# Patient Record
Sex: Male | Born: 1980
Health system: Southern US, Community
[De-identification: ages and names within clinical notes are randomized; demographics above are authoritative.]

## PROBLEM LIST (undated history)

## (undated) DIAGNOSIS — T7840XA Allergy, unspecified, initial encounter: Secondary | ICD-10-CM

## (undated) DIAGNOSIS — F840 Autistic disorder: Secondary | ICD-10-CM

## (undated) HISTORY — DX: Allergy, unspecified, initial encounter: T78.40XA

---

## 2011-11-15 DIAGNOSIS — Z111 Encounter for screening for respiratory tuberculosis: Secondary | ICD-10-CM | POA: Diagnosis not present

## 2011-11-15 DIAGNOSIS — Z0000 Encounter for general adult medical examination without abnormal findings: Secondary | ICD-10-CM | POA: Diagnosis not present

## 2012-05-25 DIAGNOSIS — Z23 Encounter for immunization: Secondary | ICD-10-CM | POA: Diagnosis not present

## 2013-06-01 DIAGNOSIS — Z23 Encounter for immunization: Secondary | ICD-10-CM | POA: Diagnosis not present

## 2013-08-28 ENCOUNTER — Ambulatory Visit (INDEPENDENT_AMBULATORY_CARE_PROVIDER_SITE_OTHER): Payer: Medicare Other | Admitting: Family Medicine

## 2013-08-28 ENCOUNTER — Encounter (INDEPENDENT_AMBULATORY_CARE_PROVIDER_SITE_OTHER): Payer: Self-pay

## 2013-08-28 ENCOUNTER — Ambulatory Visit (INDEPENDENT_AMBULATORY_CARE_PROVIDER_SITE_OTHER): Payer: Medicare Other

## 2013-08-28 ENCOUNTER — Encounter: Payer: Self-pay | Admitting: Family Medicine

## 2013-08-28 VITALS — BP 110/75 | HR 74 | Temp 98.6°F | Ht 61.5 in | Wt 136.0 lb

## 2013-08-28 DIAGNOSIS — M25562 Pain in left knee: Secondary | ICD-10-CM

## 2013-08-28 DIAGNOSIS — M25569 Pain in unspecified knee: Secondary | ICD-10-CM | POA: Diagnosis not present

## 2013-08-28 DIAGNOSIS — Z0000 Encounter for general adult medical examination without abnormal findings: Secondary | ICD-10-CM | POA: Diagnosis not present

## 2013-08-28 LAB — POCT CBC
Granulocyte percent: 69.1 %G (ref 37–80)
HCT, POC: 47.4 % (ref 43.5–53.7)
Hemoglobin: 15.3 g/dL (ref 14.1–18.1)
Lymph, poc: 2.3 (ref 0.6–3.4)
MCH, POC: 28.8 pg (ref 27–31.2)
MCHC: 32.3 g/dL (ref 31.8–35.4)
MCV: 89 fL (ref 80–97)
MPV: 8.6 fL (ref 0–99.8)
POC Granulocyte: 5.3 (ref 2–6.9)
POC LYMPH PERCENT: 29.8 %L (ref 10–50)
Platelet Count, POC: 170 10*3/uL (ref 142–424)
RBC: 5.3 M/uL (ref 4.69–6.13)
RDW, POC: 13 %
WBC: 7.7 10*3/uL (ref 4.6–10.2)

## 2013-08-28 MED ORDER — MELOXICAM 15 MG PO TABS: 15.0000 mg | ORAL_TABLET | Freq: Every day | ORAL | Status: DC

## 2013-08-28 NOTE — Patient Instructions (Signed)

## 2013-08-28 NOTE — Progress Notes (Signed)
   Subjective:    Patient ID: Douglas Willis, male    DOB: 09/26/1980, 33 y.o.   MRN: [id number]  HPI This 33 y.o. male presents for evaluation of left knee arthralgias. He had a skate board accident 10 years ago and injured his left knee. He is due for CPE.   Review of Systems C/o left knee arthralgias No chest pain, SOB, HA, dizziness, vision change, N/V, diarrhea, constipation, dysuria, urinary urgency or frequency, or rash.     Objective:   Physical Exam  Vital signs noted  Well developed well nourished male.  HEENT - Head atraumatic Normocephalic                Eyes - PERRLA, Conjuctiva - clear Sclera- Clear EOMI                Ears - EAC's Wnl TM's Wnl Gross Hearing WNL                Nose - Nares patent                 Throat - oropharanx wnl Respiratory - Lungs CTA bilateral Cardiac - RRR S1 and S2 without murmur GI - Abdomen soft Nontender and bowel sounds active x 4 Extremities - No edema. Neuro - Grossly intact. MS - Left knee with normal exam.  Xray of left knee - No fracture Prelimnary reading by Iverson Alamin     Assessment & Plan:  Left knee pain - Plan: DG Knee 1-2 Views Left, meloxicam (MOBIC) 15 MG tablet.  If pain not better then can call and will refer to ortho.  Routine general medical examination at a health care facility - Plan: POCT CBC, CMP14+EGFR, Lipid panel, Thyroid Panel With TSH, Vit D  25 hydroxy (rtn osteoporosis monitoring).  Lysbeth Penner FNP

## 2013-08-29 LAB — CMP14+EGFR
ALT: 14 IU/L (ref 0–44)
AST: 17 IU/L (ref 0–40)
Albumin/Globulin Ratio: 1.9 (ref 1.1–2.5)
Albumin: 4.8 g/dL (ref 3.5–5.5)
Alkaline Phosphatase: 63 IU/L (ref 39–117)
BUN/Creatinine Ratio: 10 (ref 8–19)
BUN: 11 mg/dL (ref 6–20)
CO2: 28 mmol/L (ref 18–29)
Calcium: 10.2 mg/dL (ref 8.7–10.2)
Chloride: 99 mmol/L (ref 97–108)
Creatinine, Ser: 1.08 mg/dL (ref 0.76–1.27)
GFR calc Af Amer: 106 mL/min/{1.73_m2} (ref >59)
GFR calc non Af Amer: 92 mL/min/{1.73_m2} (ref >59)
Globulin, Total: 2.5 g/dL (ref 1.5–4.5)
Glucose: 80 mg/dL (ref 65–99)
Potassium: 4.4 mmol/L (ref 3.5–5.2)
Sodium: 142 mmol/L (ref 134–144)
Total Bilirubin: 1.1 mg/dL (ref 0.0–1.2)
Total Protein: 7.3 g/dL (ref 6.0–8.5)

## 2013-08-29 LAB — LIPID PANEL
Chol/HDL Ratio: 2.8 ratio units (ref 0.0–5.0)
Cholesterol, Total: 250 mg/dL — ABNORMAL HIGH (ref 100–199)
HDL: 90 mg/dL (ref >39)
LDL Calculated: 146 mg/dL — ABNORMAL HIGH (ref 0–99)
Triglycerides: 69 mg/dL (ref 0–149)
VLDL Cholesterol Cal: 14 mg/dL (ref 5–40)

## 2013-08-29 LAB — VITAMIN D 25 HYDROXY (VIT D DEFICIENCY, FRACTURES): Vit D, 25-Hydroxy: 11.8 ng/mL — ABNORMAL LOW (ref 30.0–100.0)

## 2013-08-29 LAB — THYROID PANEL WITH TSH
Free Thyroxine Index: 2.1 (ref 1.2–4.9)
T3 Uptake Ratio: 32 % (ref 24–39)
T4, Total: 6.6 ug/dL (ref 4.5–12.0)
TSH: 1.34 u[IU]/mL (ref 0.450–4.500)

## 2013-08-30 ENCOUNTER — Other Ambulatory Visit: Payer: Self-pay | Admitting: Family Medicine

## 2013-09-04 ENCOUNTER — Telehealth: Payer: Self-pay | Admitting: *Deleted

## 2013-09-20 NOTE — Telephone Encounter (Signed)
Pt notified of labs

## 2013-09-20 NOTE — Telephone Encounter (Signed)
Message copied by Baltazar ApoJOYCE, Fredrico Beedle W on Thu Sep 20, 2013  4:01 PM ------      Message from: Deatra CanterXFORD, WILLIAM J      Created: Thu Aug 30, 2013 10:09 AM       Take fish oil tablets otc as directed and vitamin D 1000 iu po qd for elevated chol and low vit D. ------

## 2014-05-23 ENCOUNTER — Ambulatory Visit: Payer: Medicare Other

## 2014-05-25 DIAGNOSIS — Z23 Encounter for immunization: Secondary | ICD-10-CM | POA: Diagnosis not present

## 2014-05-30 ENCOUNTER — Ambulatory Visit: Payer: Medicare Other

## 2015-01-11 ENCOUNTER — Ambulatory Visit (INDEPENDENT_AMBULATORY_CARE_PROVIDER_SITE_OTHER): Payer: Medicare Other | Admitting: General Practice

## 2015-01-11 VITALS — BP 119/77 | HR 104 | Temp 99.1°F | Ht 61.0 in | Wt 134.4 lb

## 2015-01-11 DIAGNOSIS — B029 Zoster without complications: Secondary | ICD-10-CM

## 2015-01-11 MED ORDER — VALACYCLOVIR HCL 1 G PO TABS: 1000.0000 mg | ORAL_TABLET | Freq: Three times a day (TID) | ORAL | Status: DC

## 2015-01-11 NOTE — Progress Notes (Signed)
   Subjective:    Patient ID: Douglas Willis, male    DOB: 1981-06-03, 34 y.o.   MRN: [id number]  HPI Patient presents today complaining of left arm rash, that red and burning. He is accompanied by his father. Reports onset of red rash x 1 week ago, gradually worsened, the raised fluid filled bumps appeared on Thursday. Patient's father reports believing the rash to be poison ivy. Reports applying A&E ointment to affected and taking allergy relief tablets without improvement.      Review of Systems  Constitutional: Positive for fever. Negative for chills.  HENT: Negative for congestion, rhinorrhea, sneezing and sore throat.   Respiratory: Negative for chest tightness and shortness of breath.   Cardiovascular: Negative for chest pain and palpitations.  Gastrointestinal: Negative for abdominal distention.  Skin: Positive for rash.       Red rash to left inner arm, from arm pit to wrist       Objective:   Physical Exam  Constitutional: He appears well-developed and well-nourished.  HENT:  Head: Normocephalic and atraumatic.  Right Ear: External ear normal.  Left Ear: External ear normal.  Mouth/Throat: Oropharynx is clear and moist.  Eyes: Conjunctivae and EOM are normal. Pupils are equal, round, and reactive to light.  Cardiovascular: Regular rhythm and normal heart sounds.  Tachycardia present.   Pulmonary/Chest: Effort normal and breath sounds normal.  Skin: Rash noted. Rash is vesicular. There is erythema.     Left inner medial arm, has red based rash from just below arm pit to wrist area. Clusters of small vesicles noted along areas of rash. Negative drainage at this time.   Psychiatric: He has a normal mood and affect.          Assessment & Plan:  1. Shingles - valACYclovir (VALTREX) 1000 MG tablet; Take 1 tablet (1,000 mg total) by mouth every 8 (eight) hours.  Dispense: 21 tablet; Refill: 0 -Discussed and provided patient education  -refrain from scratching -avoid  contact with discussed persons -cover affected area loosely -Seek emergency medicine if discussed symptoms develop or current worsen -RTO prn -Patient and guardian verbalized understanding Coralie KeensMae E. Tanvir Hipple, FNP-C

## 2015-01-11 NOTE — Patient Instructions (Signed)
Shingles Shingles is caused by the same virus that causes chickenpox. The first feelings may be pain or tingling. A rash will follow in a couple days. The rash may occur on any area of the body. Long-lasting pain is more likely in an elderly person. It can last months to years. There are medicines that can help prevent pain if you start taking them early. HOME CARE   Take cool baths or place cool cloths on the rash as told by your doctor.  Take medicine only as told by your doctor.  Rest as told by your doctor.  Keep your rash clean with mild soap and cool water or as told by your doctor.  Do not scratch your rash. You may use calamine lotion to relieve itchy skin as told by your doctor.  Keep your rash covered with a loose bandage (dressing).  Avoid touching:  Babies.  Pregnant women.  Children with inflamed skin (eczema).  People who have gotten organ transplants.  People with chronic illnesses, such as leukemia or AIDS.  Wear loose-fitting clothing.  If the rash is on the face, you may need to see a specialist. Keep all appointments. Shingles must be kept away from the eyes, if possible.  Keep all follow-up visits as told by your doctor. GET HELP RIGHT AWAY IF:   You have any pain on the face or eye.  You lose feeling on one side of your face.  You have ear pain or ringing in your ear.  You cannot taste as well.  Your medicines do not help the pain.  Your redness or puffiness (swelling) spreads.  You feel like you are getting worse.  You have a fever. MAKE SURE YOU:   Understand these instructions.  Will watch your condition.  Will get help right away if you are not doing well or get worse. Document Released: 01/26/2008 Document Revised: 12/24/2013 Document Reviewed: 01/26/2008 Exit[employer] Patient Information 2015 [employer]. This information is not intended to replace advice given to you by your health care provider. Make sure you discuss any questions  you have with your health care provider.  

## 2015-05-16 DIAGNOSIS — Z23 Encounter for immunization: Secondary | ICD-10-CM | POA: Diagnosis not present

## 2015-09-05 ENCOUNTER — Encounter: Payer: Self-pay | Admitting: Family Medicine

## 2015-09-05 ENCOUNTER — Ambulatory Visit (INDEPENDENT_AMBULATORY_CARE_PROVIDER_SITE_OTHER): Payer: Medicare Other | Admitting: Family Medicine

## 2015-09-05 VITALS — BP 123/87 | HR 77 | Temp 100.2°F | Ht 61.0 in | Wt 136.0 lb

## 2015-09-05 DIAGNOSIS — B350 Tinea barbae and tinea capitis: Secondary | ICD-10-CM | POA: Diagnosis not present

## 2015-09-05 DIAGNOSIS — L738 Other specified follicular disorders: Secondary | ICD-10-CM

## 2015-09-05 MED ORDER — SULFAMETHOXAZOLE-TRIMETHOPRIM 800-160 MG PO TABS: 1.0000 | ORAL_TABLET | Freq: Two times a day (BID) | ORAL | Status: AC

## 2015-09-05 NOTE — Progress Notes (Signed)
BP 123/87 mmHg  Pulse 77  Temp(Src) 100.2 F (37.9 C) (Oral)  Ht 5\' 1"  (1.549 m)  Wt 136 lb (61.689 kg)  BMI 25.71 kg/m2   Subjective:    Patient ID: Douglas Willis, male    DOB: 1981-08-01, 35 y.o.   MRN: [id number]  HPI: Douglas Willis is a 35 y.o. male presenting on 09/05/2015 for Swelling in right side of face   HPI Bump on beard area Patient is coming in today because he has had a new lump on the right side of his face in the area where his beard comes down. He is shave that area now so we can see it better. He feels like he has a lump there and swelling and it's painful to the touch as well. He does not know that he had any fevers or chills at home but does have a temperature of 100.2 urine the office today. There has not been any drainage out of that site on the skin. He has not had similar infections before.  Relevant past medical, surgical, family and social history reviewed and updated as indicated. Interim medical history since our last visit reviewed. Allergies and medications reviewed and updated.  Review of Systems  Constitutional: Positive for fever. Negative for chills.  HENT: Negative for ear discharge and ear pain.   Eyes: Negative for discharge and visual disturbance.  Respiratory: Negative for shortness of breath and wheezing.   Cardiovascular: Negative for chest pain and leg swelling.  Gastrointestinal: Negative for abdominal pain, diarrhea and constipation.  Genitourinary: Negative for difficulty urinating.  Musculoskeletal: Negative for back pain and gait problem.  Skin: Positive for color change and rash.  Neurological: Negative for syncope, light-headedness and headaches.  All other systems reviewed and are negative.   Per HPI unless specifically indicated above     Medication List       This list is accurate as of: 09/05/15  6:15 PM.  Always use your most recent med list.               sulfamethoxazole-trimethoprim 800-160 MG tablet    Commonly known as:  BACTRIM DS  Take 1 tablet by mouth 2 (two) times daily.     Vitamin D 2000 units Caps  Take by mouth.           Objective:    BP 123/87 mmHg  Pulse 77  Temp(Src) 100.2 F (37.9 C) (Oral)  Ht 5\' 1"  (1.549 m)  Wt 136 lb (61.689 kg)  BMI 25.71 kg/m2  Wt Readings from Last 3 Encounters:  09/05/15 136 lb (61.689 kg)  01/11/15 134 lb 6.4 oz (60.963 kg)  08/28/13 136 lb (61.689 kg)    Physical Exam  Constitutional: He is oriented to person, place, and time. He appears well-developed and well-nourished. No distress.  Eyes: Conjunctivae and EOM are normal. Right eye exhibits no discharge. No scleral icterus.  Neck: Neck supple. No thyromegaly present.  Cardiovascular: Normal rate, regular rhythm, normal heart sounds and intact distal pulses.   No murmur heard. Pulmonary/Chest: Effort normal and breath sounds normal. No respiratory distress. He has no wheezes.  Musculoskeletal: Normal range of motion. He exhibits no edema.  Lymphadenopathy:    He has no cervical adenopathy.  Neurological: He is alert and oriented to person, place, and time. Coordination normal.  Skin: Skin is warm and dry. Lesion (Small erythematous papule on the right mandibular aspect in his beard. Small 0.25 cm area of induration but  no fluctuation. No drainage noted.) noted. No rash noted. He is not diaphoretic. There is erythema.  Psychiatric: He has a normal mood and affect. His behavior is normal.  Nursing note and vitals reviewed.      Assessment & Plan:   Problem List Items Addressed This Visit    None    Visit Diagnoses    Folliculitis barbae    -  Primary    Relevant Medications    sulfamethoxazole-trimethoprim (BACTRIM DS) 800-160 MG tablet        Follow up plan: Return if symptoms worsen or fail to improve.  Counseling provided for all of the vaccine components No orders of the defined types were placed in this encounter.    Arville CareJoshua Brindy Higginbotham, MD [facility] 09/05/2015, 6:15 PM

## 2016-05-12 DIAGNOSIS — Z23 Encounter for immunization: Secondary | ICD-10-CM | POA: Diagnosis not present

## 2017-05-20 DIAGNOSIS — Z23 Encounter for immunization: Secondary | ICD-10-CM | POA: Diagnosis not present

## 2019-07-27 DIAGNOSIS — Z20828 Contact with and (suspected) exposure to other viral communicable diseases: Secondary | ICD-10-CM | POA: Diagnosis not present

## 2020-03-25 ENCOUNTER — Other Ambulatory Visit: Payer: Self-pay

## 2020-03-25 ENCOUNTER — Emergency Department (HOSPITAL_COMMUNITY)
Admission: EM | Admit: 2020-03-25 | Discharge: 2020-03-25 | Disposition: A | Discharge status: Home / Self Care | Payer: Medicare HMO | Attending: Emergency Medicine | Admitting: Emergency Medicine

## 2020-03-25 ENCOUNTER — Encounter (HOSPITAL_COMMUNITY): Payer: Self-pay

## 2020-03-25 DIAGNOSIS — R109 Unspecified abdominal pain: Secondary | ICD-10-CM | POA: Diagnosis present

## 2020-03-25 DIAGNOSIS — K921 Melena: Secondary | ICD-10-CM | POA: Insufficient documentation

## 2020-03-25 HISTORY — DX: Autistic disorder: F84.0

## 2020-03-25 LAB — COMPREHENSIVE METABOLIC PANEL
ALT: 19 U/L (ref 0–44)
AST: 21 U/L (ref 15–41)
Albumin: 4.6 g/dL (ref 3.5–5.0)
Alkaline Phosphatase: 65 U/L (ref 38–126)
Anion gap: 9 (ref 5–15)
BUN: 14 mg/dL (ref 6–20)
CO2: 27 mmol/L (ref 22–32)
Calcium: 10 mg/dL (ref 8.9–10.3)
Chloride: 102 mmol/L (ref 98–111)
Creatinine, Ser: 1.11 mg/dL (ref 0.61–1.24)
GFR calc Af Amer: >60 mL/min (ref >60)
GFR calc non Af Amer: >60 mL/min (ref >60)
Glucose, Bld: 91 mg/dL (ref 70–99)
Potassium: 4.3 mmol/L (ref 3.5–5.1)
Sodium: 138 mmol/L (ref 135–145)
Total Bilirubin: 1.1 mg/dL (ref 0.3–1.2)
Total Protein: 7.9 g/dL (ref 6.5–8.1)

## 2020-03-25 LAB — TYPE AND SCREEN
ABO/RH(D): A POS
Antibody Screen: POSITIVE

## 2020-03-25 LAB — CBC
HCT: 47.8 % (ref 39.0–52.0)
Hemoglobin: 16 g/dL (ref 13.0–17.0)
MCH: 29.7 pg (ref 26.0–34.0)
MCHC: 33.5 g/dL (ref 30.0–36.0)
MCV: 88.7 fL (ref 80.0–100.0)
Platelets: 205 10*3/uL (ref 150–400)
RBC: 5.39 MIL/uL (ref 4.22–5.81)
RDW: 12.4 % (ref 11.5–15.5)
WBC: 7.6 10*3/uL (ref 4.0–10.5)
nRBC: 0 % (ref 0.0–0.2)

## 2020-03-25 LAB — POC OCCULT BLOOD, ED: Fecal Occult Bld: POSITIVE — AB

## 2020-03-25 NOTE — Discharge Instructions (Addendum)
Recommend schedule follow-up appointment with gastroenterology as well as your primary care doctor.  If you develop worsening bleeding, abdominal pain, vomiting, please return to ER for reassessment.

## 2020-03-25 NOTE — ED Triage Notes (Signed)
Patient c/o generalized abdominal pain and blood in his stool x 2 yesterday. Patient unable to state if the blood was dark or bright red.

## 2020-03-25 NOTE — ED Provider Notes (Signed)
[facility]-EMERGENCY DEPT Provider Note   CSN: [id number] Arrival date & time: 03/25/20  1219     History Chief Complaint  Patient presents with  . Blood In Stools  . Abdominal Pain    Douglas Willis is a 39 y.o. male.  Presents to ER with concern for blood in stool.  Patient reports yesterday had 2 bowel movements, both times noted bright red blood.  Felt some abdominal discomfort yesterday while having the bowel movement but does not have any ongoing abdominal pain.  Denies prior history of abdominal surgery, no prior GI bleed, not on anticoagulation.  Patient has autism, history obtained from patient and his father at bedside.  HPI     Past Medical History:  Diagnosis Date  . Allergy   . Autism     There are no problems to display for this patient.   History reviewed. No pertinent surgical history.     Family History  Problem Relation Age of Onset  . Kidney disease Father     Social History   Tobacco Use  . Smoking status: Never Smoker  . Smokeless tobacco: Never Used  Vaping Use  . Vaping Use: Never used  Substance Use Topics  . Alcohol use: No  . Drug use: No    Home Medications Prior to Admission medications   Medication Sig Start Date End Date Taking? Authorizing Provider  sulfamethoxazole-trimethoprim (BACTRIM DS) 800-160 MG tablet Take 1 tablet by mouth 2 (two) times daily. Patient not taking: Reported on 03/25/2020 09/05/15   Dettinger, Elige Radon, MD    Allergies    Patient has no known allergies.  Review of Systems   Review of Systems  Constitutional: Negative for chills and fever.  HENT: Negative for ear pain and sore throat.   Eyes: Negative for pain and visual disturbance.  Respiratory: Negative for cough and shortness of breath.   Cardiovascular: Negative for chest pain and palpitations.  Gastrointestinal: Positive for blood in stool. Negative for abdominal pain and vomiting.  Genitourinary: Negative for dysuria  and hematuria.  Musculoskeletal: Negative for arthralgias and back pain.  Skin: Negative for color change and rash.  Neurological: Negative for seizures and syncope.  All other systems reviewed and are negative.   Physical Exam Updated Vital Signs BP (!) 132/95   Pulse 69   Temp 98.2 F (36.8 C)   Resp 16   Ht 5\' 1"  (1.549 m)   Wt 67 kg   SpO2 99%   BMI 27.89 kg/m   Physical Exam Vitals and nursing note reviewed.  Constitutional:      Appearance: He is well-developed.  HENT:     Head: Normocephalic and atraumatic.  Eyes:     Conjunctiva/sclera: Conjunctivae normal.  Cardiovascular:     Rate and Rhythm: Normal rate and regular rhythm.     Heart sounds: No murmur heard.   Pulmonary:     Effort: Pulmonary effort is normal. No respiratory distress.     Breath sounds: Normal breath sounds.  Abdominal:     Palpations: Abdomen is soft.     Tenderness: There is no abdominal tenderness.  Genitourinary:    Rectum: Normal. Guaiac result positive.     Comments: Brown stool, no hemorrhoids or fissures Musculoskeletal:     Cervical back: Neck supple.  Skin:    General: Skin is warm and dry.  Neurological:     Mental Status: He is alert.     ED Results / Procedures / Treatments  Labs (all labs ordered are listed, but only abnormal results are displayed) Labs Reviewed  POC OCCULT BLOOD, ED - Abnormal; Notable for the following components:      Result Value   Fecal Occult Bld POSITIVE (*)    All other components within normal limits  COMPREHENSIVE METABOLIC PANEL  CBC  TYPE AND SCREEN    EKG None  Radiology No results found.  Procedures Procedures (including critical care time)  Medications Ordered in ED Medications - No data to display  ED Course  I have reviewed the triage vital signs and the nursing notes.  Pertinent labs & imaging results that were available during my care of the patient were reviewed by me and considered in my medical decision making  (see chart for details).    MDM Rules/Calculators/A&P                          39 year old male presents ER with bright red blood per rectum.  On exam well-appearing, soft abdomen, stable vital signs.  Stool brown, Hemoccult positive.  No anemia.  Believe he is appropriate for outpatient management.  Recommended follow-up outpatient with GI.  Reviewed return precautions and discharged home with father.    After the discussed management above, the patient was determined to be safe for discharge.  The patient was in agreement with this plan and all questions regarding their care were answered.  ED return precautions were discussed and the patient will return to the ED with any significant worsening of condition.   Final Clinical Impression(s) / ED Diagnoses Final diagnoses:  Blood in stool    Rx / DC Orders ED Discharge Orders    None       Milagros Loll, MD 03/25/20 1735

## 2020-10-13 ENCOUNTER — Emergency Department (HOSPITAL_COMMUNITY): Payer: Medicare HMO

## 2020-10-13 ENCOUNTER — Encounter (HOSPITAL_COMMUNITY): Payer: Self-pay

## 2020-10-13 ENCOUNTER — Emergency Department (HOSPITAL_COMMUNITY)
Admission: EM | Admit: 2020-10-13 | Discharge: 2020-10-13 | Disposition: A | Discharge status: Home / Self Care | Payer: Medicare HMO | Attending: Emergency Medicine | Admitting: Emergency Medicine

## 2020-10-13 DIAGNOSIS — F840 Autistic disorder: Secondary | ICD-10-CM | POA: Diagnosis not present

## 2020-10-13 DIAGNOSIS — Z008 Encounter for other general examination: Secondary | ICD-10-CM

## 2020-10-13 DIAGNOSIS — R4182 Altered mental status, unspecified: Secondary | ICD-10-CM | POA: Diagnosis not present

## 2020-10-13 DIAGNOSIS — Z20822 Contact with and (suspected) exposure to covid-19: Secondary | ICD-10-CM | POA: Diagnosis not present

## 2020-10-13 DIAGNOSIS — R440 Auditory hallucinations: Secondary | ICD-10-CM | POA: Diagnosis not present

## 2020-10-13 DIAGNOSIS — Z046 Encounter for general psychiatric examination, requested by authority: Secondary | ICD-10-CM | POA: Insufficient documentation

## 2020-10-13 DIAGNOSIS — Z79899 Other long term (current) drug therapy: Secondary | ICD-10-CM | POA: Insufficient documentation

## 2020-10-13 DIAGNOSIS — F22 Delusional disorders: Secondary | ICD-10-CM

## 2020-10-13 LAB — COMPREHENSIVE METABOLIC PANEL
ALT: 19 U/L (ref 0–44)
AST: 21 U/L (ref 15–41)
Albumin: 4.6 g/dL (ref 3.5–5.0)
Alkaline Phosphatase: 54 U/L (ref 38–126)
Anion gap: 10 (ref 5–15)
BUN: 16 mg/dL (ref 6–20)
CO2: 27 mmol/L (ref 22–32)
Calcium: 9.9 mg/dL (ref 8.9–10.3)
Chloride: 103 mmol/L (ref 98–111)
Creatinine, Ser: 1.29 mg/dL — ABNORMAL HIGH (ref 0.61–1.24)
GFR, Estimated: >60 mL/min (ref >60)
Glucose, Bld: 111 mg/dL — ABNORMAL HIGH (ref 70–99)
Potassium: 4.4 mmol/L (ref 3.5–5.1)
Sodium: 140 mmol/L (ref 135–145)
Total Bilirubin: 0.9 mg/dL (ref 0.3–1.2)
Total Protein: 8.3 g/dL — ABNORMAL HIGH (ref 6.5–8.1)

## 2020-10-13 LAB — CBC WITH DIFFERENTIAL/PLATELET
Abs Immature Granulocytes: 0.05 10*3/uL (ref 0.00–0.07)
Basophils Absolute: 0.1 10*3/uL (ref 0.0–0.1)
Basophils Relative: 1 %
Eosinophils Absolute: 0.1 10*3/uL (ref 0.0–0.5)
Eosinophils Relative: 1 %
HCT: 46.3 % (ref 39.0–52.0)
Hemoglobin: 15.4 g/dL (ref 13.0–17.0)
Immature Granulocytes: 1 %
Lymphocytes Relative: 16 %
Lymphs Abs: 1.3 10*3/uL (ref 0.7–4.0)
MCH: 29.4 pg (ref 26.0–34.0)
MCHC: 33.3 g/dL (ref 30.0–36.0)
MCV: 88.5 fL (ref 80.0–100.0)
Monocytes Absolute: 0.6 10*3/uL (ref 0.1–1.0)
Monocytes Relative: 7 %
Neutro Abs: 5.8 10*3/uL (ref 1.7–7.7)
Neutrophils Relative %: 74 %
Platelets: 228 10*3/uL (ref 150–400)
RBC: 5.23 MIL/uL (ref 4.22–5.81)
RDW: 12.9 % (ref 11.5–15.5)
WBC: 7.8 10*3/uL (ref 4.0–10.5)
nRBC: 0 % (ref 0.0–0.2)

## 2020-10-13 LAB — RAPID URINE DRUG SCREEN, HOSP PERFORMED
Amphetamines: NOT DETECTED
Barbiturates: NOT DETECTED
Benzodiazepines: NOT DETECTED
Cocaine: NOT DETECTED
Opiates: NOT DETECTED
Tetrahydrocannabinol: NOT DETECTED

## 2020-10-13 LAB — URINALYSIS, ROUTINE W REFLEX MICROSCOPIC
Bilirubin Urine: NEGATIVE
Glucose, UA: NEGATIVE mg/dL
Hgb urine dipstick: NEGATIVE
Ketones, ur: NEGATIVE mg/dL
Leukocytes,Ua: NEGATIVE
Nitrite: NEGATIVE
Protein, ur: 100 mg/dL — AB
Specific Gravity, Urine: 1.031 — ABNORMAL HIGH (ref 1.005–1.030)
pH: 5 (ref 5.0–8.0)

## 2020-10-13 LAB — RESP PANEL BY RT-PCR (FLU A&B, COVID) ARPGX2
Influenza A by PCR: NEGATIVE
Influenza B by PCR: NEGATIVE
SARS Coronavirus 2 by RT PCR: NEGATIVE

## 2020-10-13 LAB — TSH: TSH: 1.228 u[IU]/mL (ref 0.350–4.500)

## 2020-10-13 MED ORDER — LORAZEPAM 2 MG/ML IJ SOLN
1.0000 mg | Freq: Once | INTRAMUSCULAR | Status: AC
  Administered 2020-10-13: 1 mg via INTRAVENOUS
  Filled 2020-10-13: qty 1

## 2020-10-13 MED ORDER — LORAZEPAM 0.5 MG PO TABS: 0.5000 mg | ORAL_TABLET | Freq: Three times a day (TID) | ORAL | 0 refills | Status: AC | PRN

## 2020-10-13 NOTE — ED Triage Notes (Signed)
Pt arrived via walk in with father, per father, pt has been paranoid at home, expressing someone is out to get him. Progressively getting worse. No hx of psychiatric issues in the past. Pt autistic, cooperative in triage.

## 2020-10-13 NOTE — ED Provider Notes (Addendum)
[facility]-EMERGENCY DEPT Provider Note   CSN: [id number] Arrival date & time: 10/13/20  2951     History Chief Complaint  Patient presents with  . Psychiatric Evaluation    Douglas Willis is a 40 y.o. male.  HPI Patient has history of autism.  He is not typically on any medications.  He lives home with his father.  Normally he does well and does not have history of behavioral problems or hallucinations.  Recently over the past days to weeks the patient has become very paranoid.  He reports he gets very afraid that somebody is going to break into the house.  He feels compelled to try to get his father's guns out of the safe to protect them.  He is also hearing voices.  He denies any thoughts or desire to hurt or kill himself.  He also does not want to hurt anyone else but is very fearful someone is always going to try to get him.  He reports he has felt depressed lately and has had difficulty sleeping.  His father reports he has stayed awake now for long stretches.  His father tried to give him 2 Benadryl yesterday to try to help him relax.  He reports it was not helpful.  Patient does not have any history of drug or alcohol abuse.  No history of being treated with antidepressants or other psychiatric medications.  She denies she has been sick.  He denies has been suffering from headaches, visual problems.  His father reports he has been well with no fevers no chills.  He reports he is fully immunized for COVID.  Reports he is eating without difficulty.  There is been no vomiting or diarrhea.  Patient and father deny any recent traumatic events that might of triggered this.    Past Medical History:  Diagnosis Date  . Allergy   . Autism     There are no problems to display for this patient.   History reviewed. No pertinent surgical history.     Family History  Problem Relation Age of Onset  . Kidney disease Father     Social History   Tobacco Use  .  Smoking status: Never Smoker  . Smokeless tobacco: Never Used  Vaping Use  . Vaping Use: Never used  Substance Use Topics  . Alcohol use: No  . Drug use: No    Home Medications Prior to Admission medications   Medication Sig Start Date End Date Taking? Authorizing Provider  Multiple Vitamins-Minerals (MULTIVITAMIN ADULTS) TABS Take 1 tablet by mouth daily.   Yes [provider]  Multiple Vitamins-Minerals (ZINC PO) Take 1 tablet by mouth daily.   Yes [provider]  sulfamethoxazole-trimethoprim (BACTRIM DS) 800-160 MG tablet Take 1 tablet by mouth 2 (two) times daily. Patient not taking: No sig reported 09/05/15   Dettinger, Elige Radon, MD    Allergies    Patient has no known allergies.  Review of Systems   Review of Systems 10 systems reviewed and negative except as per HPI Physical Exam Updated Vital Signs BP 96/63   Pulse 70   Temp 99.3 F (37.4 C) (Oral)   Resp 13   SpO2 95%   Physical Exam Constitutional:      Comments: Patient is alert and nontoxic.  He is well-nourished well-developed.  He is sitting at the edge of stretcher.  No respiratory distress.  He does appear slightly anxious.  He is occasionally, gently pulling on the skin  on the back of his hands and shifting positions.  He has poor eye contact.  HENT:     Head: Normocephalic and atraumatic.     Nose: Nose normal.     Mouth/Throat:     Mouth: Mucous membranes are moist.     Pharynx: Oropharynx is clear.  Eyes:     Extraocular Movements: Extraocular movements intact.     Pupils: Pupils are equal, round, and reactive to light.  Cardiovascular:     Rate and Rhythm: Normal rate and regular rhythm.  Pulmonary:     Effort: Pulmonary effort is normal.     Breath sounds: Normal breath sounds.  Abdominal:     General: There is no distension.     Palpations: Abdomen is soft.     Tenderness: There is no abdominal tenderness. There is no guarding.  Musculoskeletal:        General: No  swelling or tenderness. Normal range of motion.     Cervical back: Neck supple.     Right lower leg: No edema.     Left lower leg: No edema.  Skin:    General: Skin is warm and dry.  Neurological:     General: No focal deficit present.     Mental Status: He is oriented to person, place, and time.     Cranial Nerves: No cranial nerve deficit.     Coordination: Coordination normal.  Psychiatric:     Comments: Patient is anxious in appearance.  He has poor eye contact.     ED Results / Procedures / Treatments   Labs (all labs ordered are listed, but only abnormal results are displayed) Labs Reviewed  COMPREHENSIVE METABOLIC PANEL - Abnormal; Notable for the following components:      Result Value   Glucose, Bld 111 (*)    Creatinine, Ser 1.29 (*)    Total Protein 8.3 (*)    All other components within normal limits  URINALYSIS, ROUTINE W REFLEX MICROSCOPIC - Abnormal; Notable for the following components:   Specific Gravity, Urine 1.031 (*)    Protein, ur 100 (*)    Bacteria, UA FEW (*)    All other components within normal limits  RESP PANEL BY RT-PCR (FLU A&B, COVID) ARPGX2  CBC WITH DIFFERENTIAL/PLATELET  RAPID URINE DRUG SCREEN, HOSP PERFORMED  TSH  ETHANOL  ACETAMINOPHEN LEVEL  SALICYLATE LEVEL    EKG None  Radiology CT Head Wo Contrast  Result Date: 10/13/2020 CLINICAL DATA:  Mental status change.  Paranoia. EXAM: CT HEAD WITHOUT CONTRAST TECHNIQUE: Contiguous axial images were obtained from the base of the skull through the vertex without intravenous contrast. COMPARISON:  None. FINDINGS: Brain: Within limitations of mild motion artifact, no acute infarct, intracranial hemorrhage, mass, midline shift, or extra-axial fluid collection is identified. The ventricles and sulci are normal in size. Prominent dural calcifications are incidentally noted along the falx and tentorium. Vascular: No hyperdense vessel. Skull: No fracture or suspicious osseous lesion.  Sinuses/Orbits: Visualized paranasal sinuses and mastoid air cells are clear. Visualized orbits are unremarkable. Other: None. IMPRESSION: Negative head CT. Electronically Signed   By: Sebastian Ache M.D.   On: 10/13/2020 11:03    Procedures Procedures   Medications Ordered in ED Medications  LORazepam (ATIVAN) injection 1 mg (1 mg Intravenous Given 10/13/20 1009)    ED Course  I have reviewed the triage vital signs and the nursing notes.  Pertinent labs & imaging results that were available during my care of the patient were  reviewed by me and considered in my medical decision making (see chart for details).    MDM Rules/Calculators/A&P                           Consult: Reviewed with Reola Calkins from behavioral health urgent care.  He advises that they do not typically except autism patients.  Also advises that due to this being a first presentation of psychiatric illness, patient will have to have CT head for potential admission.  Recommendation is for medical diagnostic testing and consult to TTS.  Patient has been generally cooperative.  He did receive Ativan 1 mg to facilitate CT scan.  He did not feel comfortable lying flat and made him feel like he was being "tied down".  Diagnostic work-up within normal limits.  Patient is clinically well in appearance.  Awaiting TTS evaluation for final disposition planning.  Diagnostic work-up is within normal limits.  Patient's father reports he does not wish to wait in the emergency department any longer for psychiatric consultation.  He advises that both he and his son are fatigued and would like to pursue outpatient follow-up.  Patient did get good response to Ativan.  He has been able to rest quietly without agitation.  Will provide short course of Ativan on as needed basis until they can establish psychiatric care and outpatient basis.  Return precautions reviewed.  Final Clinical Impression(s) / ED Diagnoses Final diagnoses:  Paranoid  delusion [facility])  Autism    Rx / DC Orders ED Discharge Orders    None       Arby Barrette, MD 10/13/20 1340    Arby Barrette, MD 10/13/20 1402

## 2020-10-13 NOTE — Discharge Instructions (Signed)
1.  You have been given a medication to help with severe anxiety and restlessness.  Use only as needed.  This medication, Ativan is addictive.  Is very important you follow-up soon with a counselor and or your family doctor. 2.  A resource guide has been included in your discharge instructions to help you find out patient psychiatric care. 3.  Return to the emergency department if symptoms are worsening or new concerning symptoms develop.

## 2020-10-25 DIAGNOSIS — Z20822 Contact with and (suspected) exposure to covid-19: Secondary | ICD-10-CM | POA: Diagnosis not present

## 2020-10-25 DIAGNOSIS — F840 Autistic disorder: Secondary | ICD-10-CM | POA: Diagnosis not present

## 2020-10-25 DIAGNOSIS — Z046 Encounter for general psychiatric examination, requested by authority: Secondary | ICD-10-CM | POA: Diagnosis not present

## 2020-10-25 DIAGNOSIS — F29 Unspecified psychosis not due to a substance or known physiological condition: Secondary | ICD-10-CM | POA: Diagnosis not present

## 2020-10-25 DIAGNOSIS — Z79899 Other long term (current) drug therapy: Secondary | ICD-10-CM | POA: Diagnosis not present

## 2020-10-25 DIAGNOSIS — F209 Schizophrenia, unspecified: Secondary | ICD-10-CM | POA: Diagnosis not present

## 2020-10-25 DIAGNOSIS — R443 Hallucinations, unspecified: Secondary | ICD-10-CM | POA: Diagnosis not present

## 2020-10-26 DIAGNOSIS — R443 Hallucinations, unspecified: Secondary | ICD-10-CM | POA: Diagnosis not present

## 2020-10-26 DIAGNOSIS — Z20822 Contact with and (suspected) exposure to covid-19: Secondary | ICD-10-CM | POA: Diagnosis not present

## 2020-10-27 DIAGNOSIS — F840 Autistic disorder: Secondary | ICD-10-CM | POA: Diagnosis not present

## 2020-10-27 DIAGNOSIS — F29 Unspecified psychosis not due to a substance or known physiological condition: Secondary | ICD-10-CM | POA: Diagnosis not present

## 2020-10-28 DIAGNOSIS — F840 Autistic disorder: Secondary | ICD-10-CM | POA: Diagnosis not present

## 2020-10-28 DIAGNOSIS — F29 Unspecified psychosis not due to a substance or known physiological condition: Secondary | ICD-10-CM | POA: Diagnosis not present

## 2020-10-28 DIAGNOSIS — R809 Proteinuria, unspecified: Secondary | ICD-10-CM | POA: Diagnosis not present

## 2020-10-28 DIAGNOSIS — Z046 Encounter for general psychiatric examination, requested by authority: Secondary | ICD-10-CM | POA: Diagnosis not present

## 2020-10-28 DIAGNOSIS — Z20822 Contact with and (suspected) exposure to covid-19: Secondary | ICD-10-CM | POA: Diagnosis not present

## 2020-10-28 DIAGNOSIS — F819 Developmental disorder of scholastic skills, unspecified: Secondary | ICD-10-CM | POA: Diagnosis not present

## 2020-10-28 DIAGNOSIS — E7800 Pure hypercholesterolemia, unspecified: Secondary | ICD-10-CM | POA: Diagnosis not present

## 2020-10-28 DIAGNOSIS — E538 Deficiency of other specified B group vitamins: Secondary | ICD-10-CM | POA: Diagnosis not present

## 2020-10-28 DIAGNOSIS — Z79899 Other long term (current) drug therapy: Secondary | ICD-10-CM | POA: Diagnosis not present

## 2020-10-28 DIAGNOSIS — R443 Hallucinations, unspecified: Secondary | ICD-10-CM | POA: Diagnosis not present

## 2020-10-28 DIAGNOSIS — F79 Unspecified intellectual disabilities: Secondary | ICD-10-CM | POA: Diagnosis not present

## 2020-10-28 DIAGNOSIS — F209 Schizophrenia, unspecified: Secondary | ICD-10-CM | POA: Diagnosis not present

## 2020-11-07 DIAGNOSIS — F840 Autistic disorder: Secondary | ICD-10-CM | POA: Diagnosis not present

## 2020-11-07 DIAGNOSIS — Z136 Encounter for screening for cardiovascular disorders: Secondary | ICD-10-CM | POA: Diagnosis not present

## 2020-11-07 DIAGNOSIS — Z13220 Encounter for screening for lipoid disorders: Secondary | ICD-10-CM | POA: Diagnosis not present

## 2020-11-07 DIAGNOSIS — F29 Unspecified psychosis not due to a substance or known physiological condition: Secondary | ICD-10-CM | POA: Diagnosis not present

## 2020-11-07 DIAGNOSIS — E538 Deficiency of other specified B group vitamins: Secondary | ICD-10-CM | POA: Diagnosis not present

## 2020-11-11 DIAGNOSIS — F4320 Adjustment disorder, unspecified: Secondary | ICD-10-CM | POA: Diagnosis not present

## 2020-11-19 DIAGNOSIS — F29 Unspecified psychosis not due to a substance or known physiological condition: Secondary | ICD-10-CM | POA: Diagnosis not present

## 2021-02-11 DIAGNOSIS — F29 Unspecified psychosis not due to a substance or known physiological condition: Secondary | ICD-10-CM | POA: Diagnosis not present

## 2021-04-21 DIAGNOSIS — Z20822 Contact with and (suspected) exposure to covid-19: Secondary | ICD-10-CM | POA: Diagnosis not present

## 2021-04-23 DIAGNOSIS — Z20822 Contact with and (suspected) exposure to covid-19: Secondary | ICD-10-CM | POA: Diagnosis not present

## 2021-06-05 DIAGNOSIS — Z0000 Encounter for general adult medical examination without abnormal findings: Secondary | ICD-10-CM | POA: Diagnosis not present

## 2021-06-05 DIAGNOSIS — F840 Autistic disorder: Secondary | ICD-10-CM | POA: Diagnosis not present

## 2021-07-28 DIAGNOSIS — F29 Unspecified psychosis not due to a substance or known physiological condition: Secondary | ICD-10-CM | POA: Diagnosis not present

## 2021-07-28 IMAGING — CT CT HEAD W/O CM
3 series · 14 of 47 positions shown, 16 images · non-contrast
Comparison: None.

CLINICAL DATA: Mental status change.  Paranoia.

EXAM:
CT HEAD WITHOUT CONTRAST
TECHNIQUE: Contiguous axial images were obtained from the base of the skull
through the vertex without intravenous contrast.

[Series 2: head wo · axial · 0.47mm/px · z∈[-164,-39]mm · 8 of 30 slices shown, 10 images]
[im 3/30  brain]
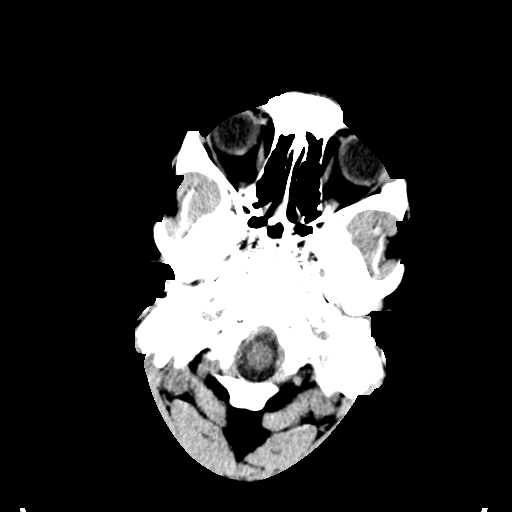
[im 3/30  bone]
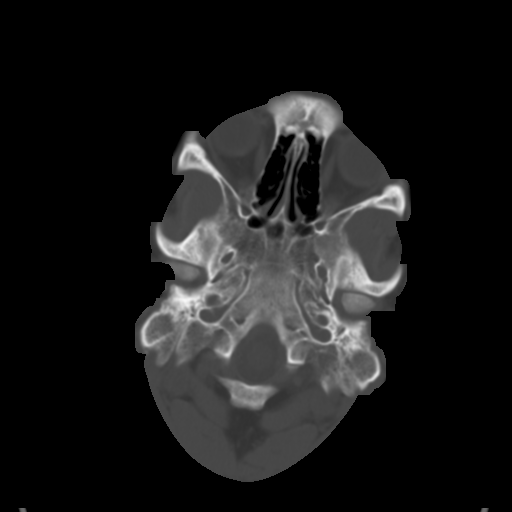
[im 7/30  brain]
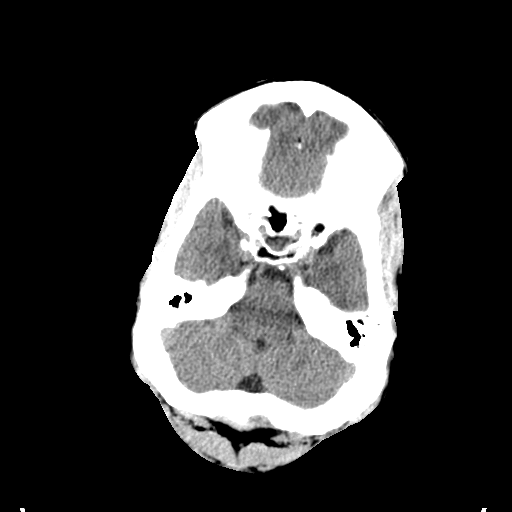
[im 10/30  brain]
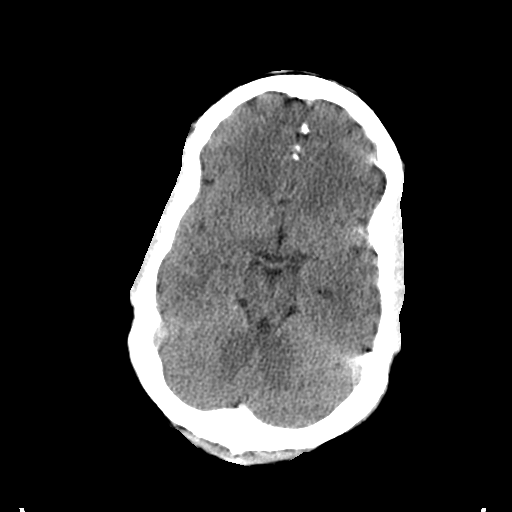
[im 14/30  brain]
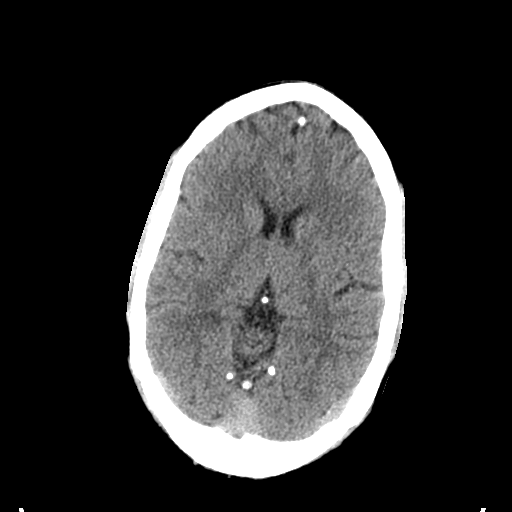
[im 17/30  brain]
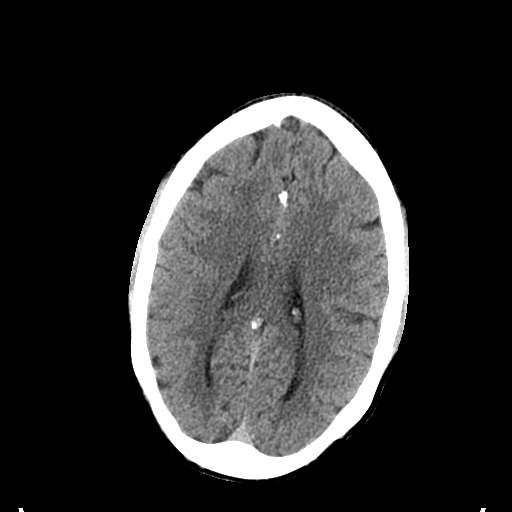
[im 17/30  bone]
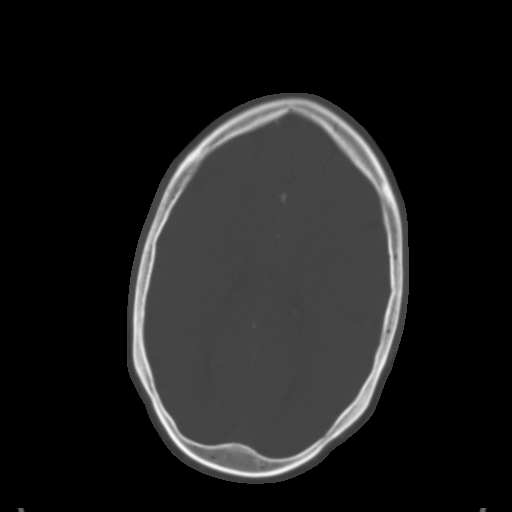
[im 21/30  brain]
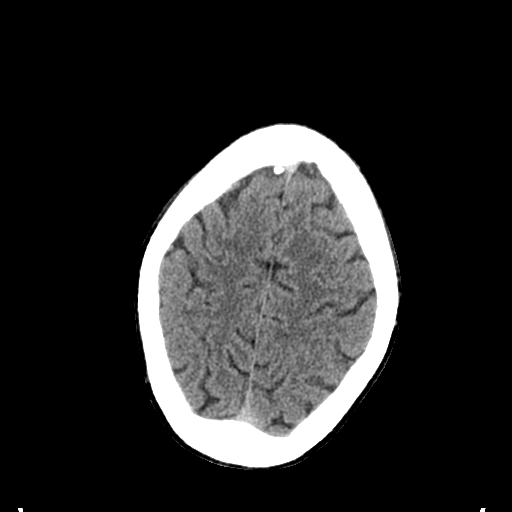
[im 24/30  brain]
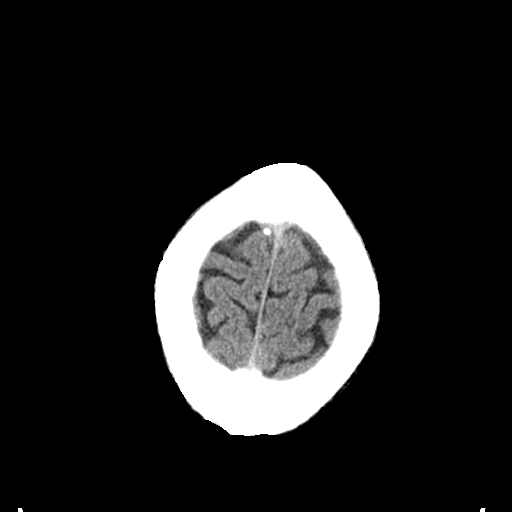
[im 28/30  brain]
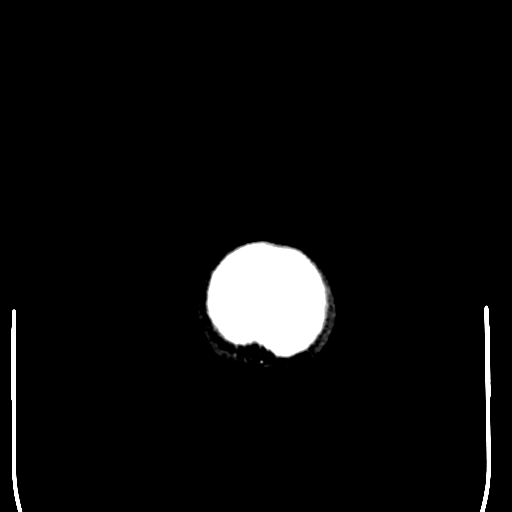

[Series 5: coronal soft tissue · coronal · 0.29mm/px · 3 of 68 slices shown]
[im 23/68  brain]
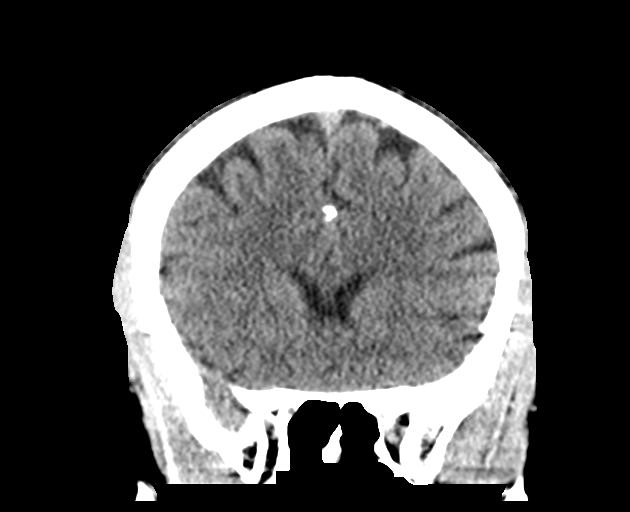
[im 30/68  brain]
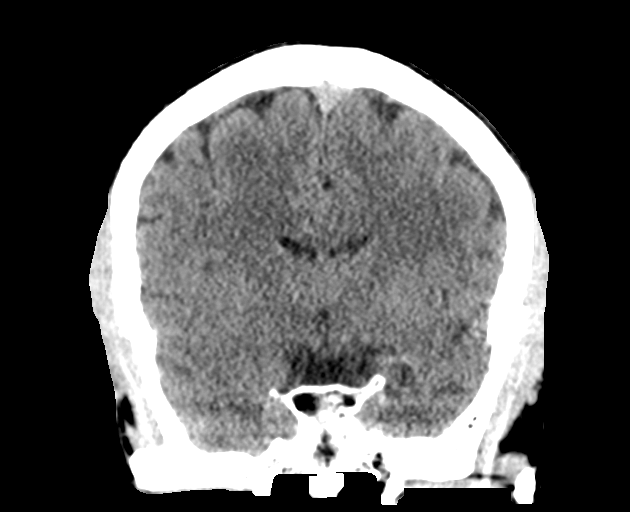
[im 38/68  brain]
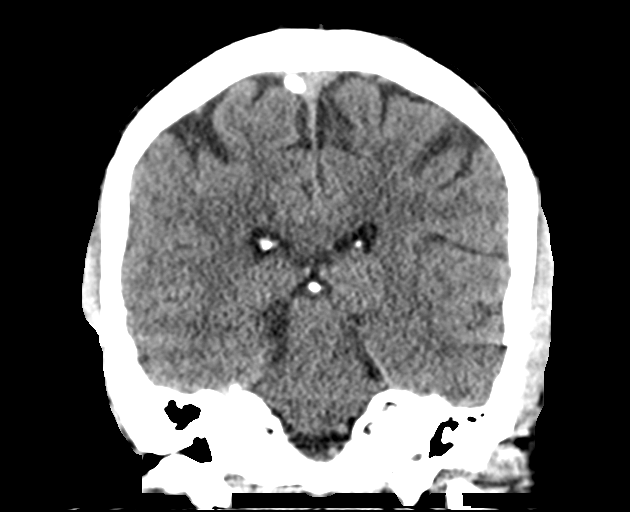

[Series 6: sagittal soft tissue · sagittal · 0.29mm/px · 3 of 53 slices shown]
[im 18/53  brain]
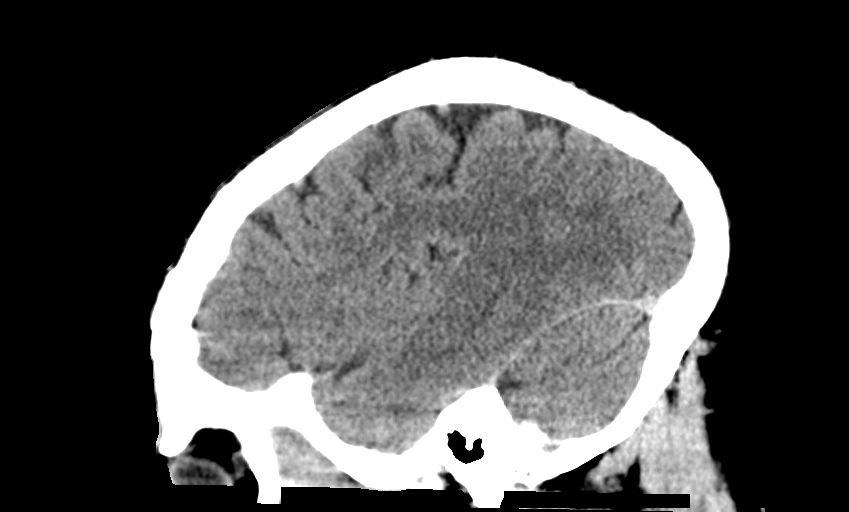
[im 27/53  brain]
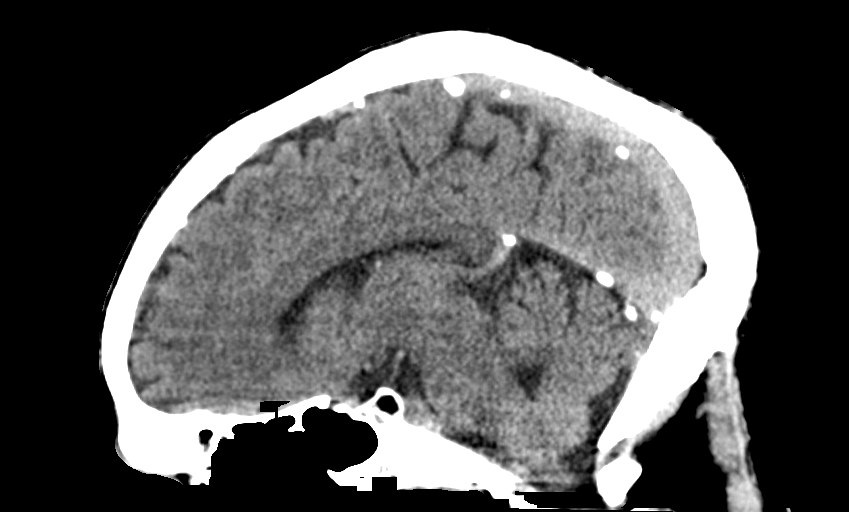
[im 35/53  brain]
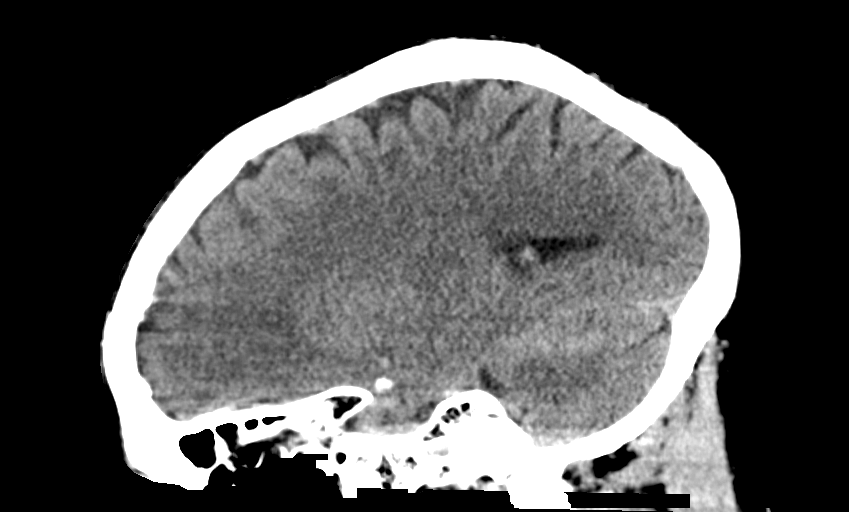

[14 of 47 positions shown; findings below may reference images not displayed]

FINDINGS: Brain: Within limitations of mild motion artifact, no acute infarct,
intracranial hemorrhage, mass, midline shift, or extra-axial fluid
collection is identified. The ventricles and sulci are normal in
size. Prominent dural calcifications are incidentally noted along
the falx and tentorium.

Vascular: No hyperdense vessel.

Skull: No fracture or suspicious osseous lesion.

Sinuses/Orbits: Visualized paranasal sinuses and mastoid air cells
are clear. Visualized orbits are unremarkable.

Other: None.
IMPRESSION: Negative head CT.

## 2022-01-06 DIAGNOSIS — F29 Unspecified psychosis not due to a substance or known physiological condition: Secondary | ICD-10-CM | POA: Diagnosis not present

## 2022-03-19 DIAGNOSIS — Z8271 Family history of polycystic kidney: Secondary | ICD-10-CM | POA: Diagnosis not present

## 2022-04-05 ENCOUNTER — Other Ambulatory Visit: Payer: Self-pay | Admitting: Family Medicine

## 2022-04-05 ENCOUNTER — Ambulatory Visit
Admission: RE | Admit: 2022-04-05 | Discharge: 2022-04-05 | Disposition: A | Discharge status: Home / Self Care | Payer: Medicare HMO | Source: Ambulatory Visit | Attending: Family Medicine | Admitting: Family Medicine

## 2022-04-05 DIAGNOSIS — Z8271 Family history of polycystic kidney: Secondary | ICD-10-CM | POA: Diagnosis not present

## 2022-06-07 DIAGNOSIS — F840 Autistic disorder: Secondary | ICD-10-CM | POA: Diagnosis not present

## 2022-06-07 DIAGNOSIS — Z0000 Encounter for general adult medical examination without abnormal findings: Secondary | ICD-10-CM | POA: Diagnosis not present

## 2022-06-07 DIAGNOSIS — F29 Unspecified psychosis not due to a substance or known physiological condition: Secondary | ICD-10-CM | POA: Diagnosis not present

## 2022-06-23 DIAGNOSIS — F29 Unspecified psychosis not due to a substance or known physiological condition: Secondary | ICD-10-CM | POA: Diagnosis not present

## 2022-12-08 DIAGNOSIS — F29 Unspecified psychosis not due to a substance or known physiological condition: Secondary | ICD-10-CM | POA: Diagnosis not present

## 2023-04-27 DIAGNOSIS — F29 Unspecified psychosis not due to a substance or known physiological condition: Secondary | ICD-10-CM | POA: Diagnosis not present

## 2023-09-19 DIAGNOSIS — F840 Autistic disorder: Secondary | ICD-10-CM | POA: Diagnosis not present

## 2023-09-19 DIAGNOSIS — E782 Mixed hyperlipidemia: Secondary | ICD-10-CM | POA: Diagnosis not present

## 2023-09-19 DIAGNOSIS — F29 Unspecified psychosis not due to a substance or known physiological condition: Secondary | ICD-10-CM | POA: Diagnosis not present

## 2023-09-19 DIAGNOSIS — Z0000 Encounter for general adult medical examination without abnormal findings: Secondary | ICD-10-CM | POA: Diagnosis not present

## 2023-09-19 DIAGNOSIS — E538 Deficiency of other specified B group vitamins: Secondary | ICD-10-CM | POA: Diagnosis not present

## 2023-09-23 DIAGNOSIS — F29 Unspecified psychosis not due to a substance or known physiological condition: Secondary | ICD-10-CM | POA: Diagnosis not present

## 2024-02-07 ENCOUNTER — Telehealth: Payer: Self-pay | Admitting: Pharmacist

## 2024-02-07 NOTE — Progress Notes (Addendum)
   02/07/2024  Patient ID: Luise JONETTA Louder, male   DOB: 05-16-1981, 43 y.o.   MRN: [id number]  Patient appeared on insurance report for at-risk for failing 2025 metric: Medication Adherence for Cholesterol (MAC)   Outreach to the patient was Unsuccessful.   Medication: Rosuvastatin 5mg   Last fill date: 11/13/23 30DS  Fail date: 6/5 (has 2+ fills already)  Patient already failed adherence metrics for 2025.   Tried calling patient's mother (on [place]) regarding the above. Unable to reach at this time. Left voicemail requesting call back at earliest convenience to discuss concerns.   Update from 02/14/24: Called and left voicemail about the above. Can reach me at [phone] if patient called back. Called the pharmacy and requested refill of Rosuvastatin. Reports the Rosuvastatin was transferred and filled in [place] the last time. Due to this, will hold off on filling because unknown as to where patient is located. Will call again in 1 week.   Update from 02/21/24: Called the [facility] in [place] (has been getting Risperidone refilled here). Left voicemail to have Rosuvastatin 5mg  filled when they get a chance. Wouldn't let me speak to anyone.     Aloysius Lewis, PharmD [facility]  Phone Number: [phone]

## 2024-02-10 DIAGNOSIS — F29 Unspecified psychosis not due to a substance or known physiological condition: Secondary | ICD-10-CM | POA: Diagnosis not present

## 2024-03-25 DIAGNOSIS — R519 Headache, unspecified: Secondary | ICD-10-CM | POA: Diagnosis not present

## 2024-03-25 DIAGNOSIS — J302 Other seasonal allergic rhinitis: Secondary | ICD-10-CM | POA: Diagnosis not present

## 2024-03-25 DIAGNOSIS — J019 Acute sinusitis, unspecified: Secondary | ICD-10-CM | POA: Diagnosis not present

## 2024-03-25 DIAGNOSIS — B9689 Other specified bacterial agents as the cause of diseases classified elsewhere: Secondary | ICD-10-CM | POA: Diagnosis not present
